# Patient Record
Sex: Male | Born: 1945 | Race: White | Hispanic: No | Marital: Married | Smoking: Never smoker
Health system: Southern US, Community
[De-identification: ages and names within clinical notes are randomized; demographics above are authoritative.]

## PROBLEM LIST (undated history)

## (undated) DIAGNOSIS — I10 Essential (primary) hypertension: Secondary | ICD-10-CM

## (undated) HISTORY — PX: TONSILLECTOMY: SUR1361

---

## 2013-11-23 ENCOUNTER — Emergency Department
Admission: EM | Admit: 2013-11-23 | Discharge: 2013-11-23 | Disposition: A | Discharge status: Home / Self Care | Payer: Medicare Other | Source: Home / Self Care | Attending: Family Medicine | Admitting: Family Medicine

## 2013-11-23 ENCOUNTER — Encounter: Payer: Self-pay | Admitting: Emergency Medicine

## 2013-11-23 DIAGNOSIS — S61210A Laceration without foreign body of right index finger without damage to nail, initial encounter: Secondary | ICD-10-CM

## 2013-11-23 DIAGNOSIS — S61209A Unspecified open wound of unspecified finger without damage to nail, initial encounter: Secondary | ICD-10-CM

## 2013-11-23 HISTORY — DX: Essential (primary) hypertension: I10

## 2013-11-23 NOTE — ED Provider Notes (Signed)
CSN: [id number]     Arrival date & time 11/23/13  1443 History   First MD Initiated Contact with Patient 11/23/13 1452     Chief Complaint  Patient presents with  . Extremity Laceration    R index finger      HPI Comments: Patient was using an electric lawn blower about 2 hours prior when the plastic fan blade accidentally lacerated the tip of his second finger.  His Tdap is current.  Patient is a 68 y.o. male presenting with skin laceration. The history is provided by the patient.  Laceration Location:  Finger Finger laceration location:  R index finger Length (cm):  2 Depth:  Through dermis Quality comment:  Flap laceration Bleeding: controlled with pressure   Time since incident:  2 hours Injury mechanism: fan blade edge. Pain details:    Quality:  Aching   Severity:  Mild   Progression:  Unchanged Foreign body present:  No foreign bodies Relieved by:  Pressure Worsened by:  Movement Ineffective treatments:  None tried Tetanus status:  Up to date   Past Medical History  Diagnosis Date  . Hypertension    Past Surgical History  Procedure Laterality Date  . Tonsillectomy     History reviewed. No pertinent family history. History  Substance Use Topics  . Smoking status: Never Smoker   . Smokeless tobacco: Never Used  . Alcohol Use: Not on file    Review of Systems  All other systems reviewed and are negative.   Allergies  Penicillins  Home Medications   Prior to Admission medications   Medication Sig Start Date End Date Taking? Authorizing Provider  olmesartan (BENICAR) 20 MG tablet Take 10 mg by mouth daily.   Yes Historical Provider, MD   BP 179/95  Pulse 76  Temp(Src) 97.8 F (36.6 C) (Oral)  Ht  (1.676 m)  Wt 160 lb (72.576 kg)  BMI 25.84 kg/m2  SpO2 98% Physical Exam  Nursing note and vitals reviewed. Constitutional: He is oriented to person, place, and time. He appears well-developed and well-nourished. No distress.  HENT:  Head:  Atraumatic.  Eyes: Conjunctivae are normal. Pupils are equal, round, and reactive to light.  Musculoskeletal:       Right hand: He exhibits laceration. He exhibits no swelling. Normal sensation noted. Normal strength noted.       Hands: Right second finger distal phalanx has a 2cm long flap laceration on its radial aspect, not involving nail bed.  Distal neurovascular function is intact. DIP joint has full range of motion.  Neurological: He is alert and oriented to person, place, and time.  Skin: Skin is warm and dry.    ED Course  Procedures  Laceration Repair Discussed benefits and risks of procedure and verbal consent obtained. Using sterile technique and digital 2% lidocaine without epinephrine, cleansed wound with Betadine followed by copious lavage with normal saline.  Wound carefully inspected for debris and foreign bodies; none found.  Wound closed with #7, 5-0 interrupted nylon sutures.  Bacitracin and non-stick sterile dressing applied.  Wound precautions explained to patient.  Return for suture removal in 10 days.     MDM   1. Laceration of right index finger w/o foreign body w/o damage to nail, initial encounter     Change dressing daily and apply Bacitracin ointment to wound.  Keep wound clean and dry.  Return for any signs of infection (or follow-up with family doctor):  Increasing redness, swelling, pain, heat, drainage, etc. Return  in 10 days for suture removal.      Lattie Haw, MD 11/23/13 1630

## 2013-11-23 NOTE — ED Notes (Signed)
Laceration of tip of R index finger in blower fan. Tetanus 3 yrs

## 2013-11-23 NOTE — Discharge Instructions (Signed)
Change dressing daily and apply Bacitracin ointment to wound.  Keep wound clean and dry.  Return for any signs of infection (or follow-up with family doctor):  Increasing redness, swelling, pain, heat, drainage, etc. °Return in 10 days for suture removal.   ° ° °Laceration Care, Adult °A laceration is a cut or lesion that goes through all layers of the skin and into the tissue just beneath the skin. °TREATMENT  °Some lacerations may not require closure. Some lacerations may not be able to be closed due to an increased risk of infection. It is important to see your caregiver as soon as possible after an injury to minimize the risk of infection and maximize the opportunity for successful closure. °If closure is appropriate, pain medicines may be given, if needed. The wound will be cleaned to help prevent infection. Your caregiver will use stitches (sutures), staples, wound glue (adhesive), or skin adhesive strips to repair the laceration. These tools bring the skin edges together to allow for faster healing and a better cosmetic outcome. However, all wounds will heal with a scar. Once the wound has healed, scarring can be minimized by covering the wound with sunscreen during the day for 1 full year. °HOME CARE INSTRUCTIONS  °For sutures or staples: °· Keep the wound clean and dry. °· If you were given a bandage (dressing), you should change it at least once a day. Also, change the dressing if it becomes wet or dirty, or as directed by your caregiver. °· Wash the wound with soap and water 2 times a day. Rinse the wound off with water to remove all soap. Pat the wound dry with a clean towel. °· After cleaning, apply a thin layer of the antibiotic ointment as recommended by your caregiver. This will help prevent infection and keep the dressing from sticking. °· You may shower as usual after the first 24 hours. Do not soak the wound in water until the sutures are removed. °· Only take over-the-counter or prescription  medicines for pain, discomfort, or fever as directed by your caregiver. °· Get your sutures or staples removed as directed by your caregiver. °For skin adhesive strips: °· Keep the wound clean and dry. °· Do not get the skin adhesive strips wet. You may bathe carefully, using caution to keep the wound dry. °· If the wound gets wet, pat it dry with a clean towel. °· Skin adhesive strips will fall off on their own. You may trim the strips as the wound heals. Do not remove skin adhesive strips that are still stuck to the wound. They will fall off in time. °For wound adhesive: °· You may briefly wet your wound in the shower or bath. Do not soak or scrub the wound. Do not swim. Avoid periods of heavy perspiration until the skin adhesive has fallen off on its own. After showering or bathing, gently pat the wound dry with a clean towel. °· Do not apply liquid medicine, cream medicine, or ointment medicine to your wound while the skin adhesive is in place. This may loosen the film before your wound is healed. °· If a dressing is placed over the wound, be careful not to apply tape directly over the skin adhesive. This may cause the adhesive to be pulled off before the wound is healed. °· Avoid prolonged exposure to sunlight or tanning lamps while the skin adhesive is in place. Exposure to ultraviolet light in the first year will darken the scar. °· The skin adhesive will usually   remain in place for 5 to 10 days, then naturally fall off the skin. Do not pick at the adhesive film. °You may need a tetanus shot if: °· You cannot remember when you had your last tetanus shot. °· You have never had a tetanus shot. °If you get a tetanus shot, your arm may swell, get red, and feel warm to the touch. This is common and not a problem. If you need a tetanus shot and you choose not to have one, there is a rare chance of getting tetanus. Sickness from tetanus can be serious. °SEEK MEDICAL CARE IF:  °· You have redness, swelling, or  increasing pain in the wound. °· You see a red line that goes away from the wound. °· You have yellowish-white fluid (pus) coming from the wound. °· You have a fever. °· You notice a bad smell coming from the wound or dressing. °· Your wound breaks open before or after sutures have been removed. °· You notice something coming out of the wound such as wood or glass. °· Your wound is on your hand or foot and you cannot move a finger or toe. °SEEK IMMEDIATE MEDICAL CARE IF:  °· Your pain is not controlled with prescribed medicine. °· You have severe swelling around the wound causing pain and numbness or a change in color in your arm, hand, leg, or foot. °· Your wound splits open and starts bleeding. °· You have worsening numbness, weakness, or loss of function of any joint around or beyond the wound. °· You develop painful lumps near the wound or on the skin anywhere on your body. °MAKE SURE YOU:  °· Understand these instructions. °· Will watch your condition. °· Will get help right away if you are not doing well or get worse. °Document Released: 03/20/2005 Document Revised: 06/12/2011 Document Reviewed: 09/13/2010 °ExitCare® Patient Information ©2015 [employer]. This information is not intended to replace advice given to you by your health care provider. Make sure you discuss any questions you have with your health care provider. ° °

## 2013-12-04 ENCOUNTER — Emergency Department (INDEPENDENT_AMBULATORY_CARE_PROVIDER_SITE_OTHER)
Admission: EM | Admit: 2013-12-04 | Discharge: 2013-12-04 | Disposition: A | Discharge status: Home / Self Care | Payer: Medicare Other | Source: Home / Self Care

## 2013-12-04 ENCOUNTER — Encounter: Payer: Self-pay | Admitting: Emergency Medicine

## 2013-12-04 DIAGNOSIS — Z4802 Encounter for removal of sutures: Secondary | ICD-10-CM

## 2013-12-04 NOTE — ED Notes (Signed)
Pt is here for suture removal of right index finger. No s/s of infection. Feeling some pain with nail growing back.

## 2013-12-04 NOTE — ED Provider Notes (Signed)
CSN: [id number]     Arrival date & time 12/04/13  2952 History   None    Chief Complaint  Patient presents with  . Suture / Staple Removal   (Consider location/radiation/quality/duration/timing/severity/associated sxs/prior Treatment) HPI Pt is here for suture removal of right index finger.  Laceration was repaired here on 11/23/13.  No s/s of infection. Feeling some mild pain with nail growing back.  Denies numbness or weakness of right index finger.  Past Medical History  Diagnosis Date  . Hypertension    Past Surgical History  Procedure Laterality Date  . Tonsillectomy     History reviewed. No pertinent family history. History  Substance Use Topics  . Smoking status: Never Smoker   . Smokeless tobacco: Never Used  . Alcohol Use: Not on file    Review of Systems  Allergies  Penicillins  Home Medications   Prior to Admission medications   Medication Sig Start Date End Date Taking? Authorizing Provider  olmesartan (BENICAR) 20 MG tablet Take 10 mg by mouth daily.    Historical Provider, MD   BP 171/99  Pulse 83  Temp(Src) 97.8 F (36.6 C) (Oral) Physical Exam Wound right index finger is clean and dry and well approximated and healing well. Fingernail intact . Range of motion, tenderness, neurovascular distally intact ED Course  Procedures (including critical care time) Labs Review Labs Reviewed - No data to display  Imaging Review No results found.   MDM   1. Encounter for removal of sutures    status post laceration right index finger repair--healing well Today, 7 sutures removed without problems. Dressing applied. Further aftercare discussed. Red flags discussed. Recheck when necessary. Also, I briefly mentioned elevated BP, advised him to followup with PCP.--He states he just took his BP meds before coming to visit today    Lajean Manes, MD 12/04/13 [phone]

## 2013-12-22 ENCOUNTER — Encounter: Payer: Self-pay | Admitting: Emergency Medicine

## 2013-12-22 ENCOUNTER — Emergency Department
Admission: EM | Admit: 2013-12-22 | Discharge: 2013-12-22 | Disposition: A | Discharge status: Home / Self Care | Payer: Medicare Other | Source: Home / Self Care | Attending: Emergency Medicine | Admitting: Emergency Medicine

## 2013-12-22 DIAGNOSIS — L03019 Cellulitis of unspecified finger: Secondary | ICD-10-CM

## 2013-12-22 DIAGNOSIS — L03011 Cellulitis of right finger: Principal | ICD-10-CM

## 2013-12-22 DIAGNOSIS — L02519 Cutaneous abscess of unspecified hand: Secondary | ICD-10-CM

## 2013-12-22 DIAGNOSIS — IMO0001 Reserved for inherently not codable concepts without codable children: Secondary | ICD-10-CM

## 2013-12-22 MED ORDER — DOXYCYCLINE HYCLATE 100 MG PO CAPS: 100.0000 mg | ORAL_CAPSULE | Freq: Two times a day (BID) | ORAL | Status: DC

## 2013-12-22 NOTE — ED Provider Notes (Signed)
CSN: [id number]     Arrival date & time 12/22/13  0845 History   First MD Initiated Contact with Patient 12/22/13 [phone]     Chief Complaint  Patient presents with  . Wound Infection   (Consider location/radiation/quality/duration/timing/severity/associated sxs/prior Treatment) HPI Reports developing infection in right finger about 5 days ago; same finger which was lacerated 11/23/2013 and treated here [facility] with suturing/ suturee removal 12/04/2013.--I. reviewed notes of 8/23 and 9/3, and the laceration is healed without sign of infection as of 12/04/13 visit.  He recalls no injury since the laceration of 8/23. Complains of mild redness and pain and swelling right index finger for 5 days. No drainage or open wound. Denies numbness or weakness. No fever or chills or chest pain or shortness of breath  His wife has been applying OTC cortisone cream without improvement Past Medical History  Diagnosis Date  . Hypertension    Past Surgical History  Procedure Laterality Date  . Tonsillectomy     History reviewed. No pertinent family history. History  Substance Use Topics  . Smoking status: Never Smoker   . Smokeless tobacco: Never Used  . Alcohol Use: Not on file    Review of Systems  All other systems reviewed and are negative.   Allergies  Penicillins  Home Medications   Prior to Admission medications   Medication Sig Start Date End Date Taking? Authorizing Provider  doxycycline (VIBRAMYCIN) 100 MG capsule Take 1 capsule (100 mg total) by mouth 2 (two) times daily. 12/22/13   Lajean Manes, MD  olmesartan (BENICAR) 20 MG tablet Take 10 mg by mouth daily.    Historical Provider, MD   BP 161/91  Pulse 81  Temp(Src) 97.8 F (36.6 C) (Oral)  Resp 16  Ht  (1.676 m)  Wt 162 lb (73.483 kg)  BMI 26.16 kg/m2  SpO2 97% Physical Exam  Nursing note and vitals reviewed. Constitutional: He is oriented to person, place, and time. He appears well-developed and well-nourished. No  distress.  HENT:  Head: Normocephalic and atraumatic.  Eyes: Conjunctivae and EOM are normal. Pupils are equal, round, and reactive to light. No scleral icterus.  Neck: Normal range of motion.  Cardiovascular: Normal rate.   Pulmonary/Chest: Effort normal.  Abdominal: He exhibits no distension.  Musculoskeletal: Normal range of motion.  Right index finger: Prior wound is well healed without sign of infection at the approximated healed edges.  Right index fingertip mildly red and swollen and mildly warm and indurated without fluctuance or red streaks or drainage or bleeding.  Neurological: He is alert and oriented to person, place, and time.  Skin: Skin is warm.  Psychiatric: He has a normal mood and affect.    ED Course  Procedures (including critical care time) Labs Review Labs Reviewed - No data to display  Imaging Review No results found.   MDM   1. Cellulitis of second finger, right   I don't think this was directly related to the prior wound, as it had healed without sign of infection as of 12/04/13.  Treatment options discussed, as well as risks, benefits, alternatives. Patient and wife voiced understanding and agreement with the following plans:  Doxycycline po BID, topical bacitracin.  red flags discussed with patient and wife. Wife has been using cortisone cream, advised to avoid that.  Follow-up with your primary care doctor in 5-7 days if not improving, or sooner if symptoms become worse. Precautions discussed. Red flags discussed. Questions invited and answered. Patient and wife voiced understanding  and agreement.    Lajean Manes, MD 12/22/13 2106

## 2013-12-22 NOTE — ED Notes (Signed)
Reports developing infection in right finger about 5 days ago; same finger which was lacerated 11/23/2013 and treated here [facility] with suturing/ suturee removal 12/04/2013. Finger tip red and edematous.

## 2013-12-23 ENCOUNTER — Telehealth: Payer: Self-pay | Admitting: *Deleted

## 2013-12-26 ENCOUNTER — Telehealth: Payer: Self-pay | Admitting: *Deleted

## 2014-04-29 ENCOUNTER — Encounter: Payer: Self-pay | Admitting: *Deleted

## 2014-04-29 ENCOUNTER — Emergency Department (INDEPENDENT_AMBULATORY_CARE_PROVIDER_SITE_OTHER): Payer: Medicare HMO

## 2014-04-29 ENCOUNTER — Emergency Department
Admission: EM | Admit: 2014-04-29 | Discharge: 2014-04-29 | Disposition: A | Discharge status: Home / Self Care | Payer: Commercial Managed Care - HMO | Source: Home / Self Care | Attending: Family Medicine | Admitting: Family Medicine

## 2014-04-29 DIAGNOSIS — B9789 Other viral agents as the cause of diseases classified elsewhere: Principal | ICD-10-CM

## 2014-04-29 DIAGNOSIS — J069 Acute upper respiratory infection, unspecified: Secondary | ICD-10-CM

## 2014-04-29 DIAGNOSIS — H6121 Impacted cerumen, right ear: Secondary | ICD-10-CM

## 2014-04-29 DIAGNOSIS — R05 Cough: Secondary | ICD-10-CM

## 2014-04-29 MED ORDER — GUAIFENESIN-CODEINE 100-10 MG/5ML PO SOLN: ORAL | Status: DC

## 2014-04-29 MED ORDER — CEFDINIR 300 MG PO CAPS: 300.0000 mg | ORAL_CAPSULE | Freq: Two times a day (BID) | ORAL | Status: DC

## 2014-04-29 NOTE — Discharge Instructions (Signed)
Take plain guaifenesin 1200mg  (Mucinex) twice daily, with plenty of water, for cough and congestion.  Get adequate rest.   May use Afrin nasal spray (or generic oxymetazoline) twice daily for about 5 days.  Also recommend using saline nasal spray several times daily and saline nasal irrigation (AYR is a common brand) Try warm salt water gargles for sore throat.  Stop all antihistamines for now, and other non-prescription cough/cold preparations. Follow-up with family doctor if not improving about 7 to 10 days.    Cerumen Impaction A cerumen impaction is when the wax in your ear forms a plug. This plug usually causes reduced hearing. Sometimes it also causes an earache or dizziness. Removing a cerumen impaction can be difficult and painful. The wax sticks to the ear canal. The canal is sensitive and bleeds easily. If you try to remove a heavy wax buildup with a cotton tipped swab, you may push it in further. Irrigation with water, suction, and small ear curettes may be used to clear out the wax. If the impaction is fixed to the skin in the ear canal, ear drops may be needed for a few days to loosen the wax. People who build up a lot of wax frequently can use ear wax removal products available in your local drugstore. SEEK MEDICAL CARE IF:  You develop an earache, increased hearing loss, or marked dizziness. Document Released: 04/27/2004 Document Revised: 06/12/2011 Document Reviewed: 06/17/2009 [facility] Patient Information 2015 [place], [place]. This information is not intended to replace advice given to you by your health care provider. Make sure you discuss any questions you have with your health care provider.

## 2014-04-29 NOTE — ED Notes (Signed)
Jonathan Tyler c/o dry cough x 2 weeks, worsening over the last couple days and productive at times. Taken Coricidin OTC. Denies fever until possible low grade last night.

## 2014-04-29 NOTE — ED Provider Notes (Signed)
CSN: [id number]     Arrival date & time 04/29/14  [id number] History   First MD Initiated Contact with Patient 04/29/14 [phone]     Chief Complaint  Patient presents with  . Cough      HPI Comments: Patient reports that he has had a persistent mild cough upon awakening for several months.  He denies reflux symptoms. Over the past three days his cough has increased, and he developed pleuritic pain in his right anterior inferior chest.  No shortness of breath.  He has had sinus congestion for two days with onset of a mild sore throat, worse when coughing.  He has been fatigued and developed a low grade fever yesterday. He has a past history of pneumonia 3 to 4 years ago.  The history is provided by the patient and the spouse.    Past Medical History  Diagnosis Date  . Hypertension    Past Surgical History  Procedure Laterality Date  . Tonsillectomy     History reviewed. No pertinent family history. History  Substance Use Topics  . Smoking status: Never Smoker   . Smokeless tobacco: Never Used  . Alcohol Use: No    Review of Systems + sore throat with cough + cough + pleuritic pain right anterior chest No wheezing + nasal congestion + post-nasal drainage No sinus pain/pressure No itchy/red eyes No earache No hemoptysis No SOB + fever, + chills No nausea No vomiting No abdominal pain No diarrhea No urinary symptoms No skin rash + fatigue + myalgias + headache Used OTC meds without relief  Allergies  Penicillins; Clindamycin/lincomycin; and Doxycycline  Home Medications   Prior to Admission medications   Medication Sig Start Date End Date Taking? Authorizing Provider  olmesartan-hydrochlorothiazide (BENICAR HCT) 40-25 MG per tablet Take 1 tablet by mouth daily.   Yes Historical Provider, MD  cefdinir (OMNICEF) 300 MG capsule Take 1 capsule (300 mg total) by mouth 2 (two) times daily. 04/29/14   Lattie HawStephen A Britni Driscoll, MD  guaiFENesin-codeine 100-10 MG/5ML syrup Take 10mL by  mouth at bedtime as needed for cough 04/29/14   Lattie HawStephen A Onis Markoff, MD   BP 165/80 mmHg  Pulse 97  Temp(Src) 98.8 F (37.1 C) (Oral)  Resp 16  Wt 165 lb (74.844 kg)  SpO2 95% Physical Exam Nursing notes and Vital Signs reviewed. Appearance:  Patient appears stated age, and in no acute distress Eyes:  Pupils are equal, round, and reactive to light and accomodation.  Extraocular movement is intact.  Conjunctivae are not inflamed  Ears:   Left canal and tympanic membrane normal.  Right canal partly occluded with cerumen; unable to visualize TM.  Post lavage, canal and TM normal Nose:  Mildly congested turbinates.  No sinus tenderness.   Pharynx:   Minimal erythema Neck:  Supple.   Tender enlarged posterior nodes are palpated bilaterally  Lungs:  Clear to auscultation.  Breath sounds are equal.  Chest:  Mild tenderness to palpation over the lower sternum.  Heart:  Regular rate and rhythm without murmurs, rubs, or gallops.  Abdomen:  Nontender without masses or hepatosplenomegaly.  Bowel sounds are present.  No CVA or flank tenderness.  Extremities:  No edema.  No calf tenderness Skin:  No rash present.   ED Course  Procedures  none    Imaging Review Dg Chest 2 View  04/29/2014   CLINICAL DATA:  Cough, congestion for 3 days.  EXAM: CHEST  2 VIEW  COMPARISON:  None.  FINDINGS: The heart size  and mediastinal contours are within normal limits. Both lungs are clear. No pneumothorax or pleural effusion is noted. Anterior osteophyte formation is noted in mid thoracic spine.  IMPRESSION: No acute cardiopulmonary abnormality seen.   Electronically Signed   By: Roque Lias M.D.   On: 04/29/2014 09:02     MDM   1. Viral URI with cough   2. Cerumen impaction, right    Prefer to give azithromycin or doxycycline, but patient does not tolerate.  Will begin Omnicef (he states that he has taken Keflex in the past without adverse effect).  Take plain guaifenesin  (Mucinex) twice daily, with  plenty of water, for cough and congestion.  Get adequate rest.   May use Afrin nasal spray (or generic oxymetazoline) twice daily for about 5 days.  Also recommend using saline nasal spray several times daily and saline nasal irrigation (AYR is a common brand) Try warm salt water gargles for sore throat.  Stop all antihistamines for now, and other non-prescription cough/cold preparations. Follow-up with family doctor if not improving about 7 to 10 days.     Lattie Haw, MD 04/29/14 [phone]

## 2014-05-01 ENCOUNTER — Telehealth: Payer: Self-pay | Admitting: Emergency Medicine

## 2015-06-26 IMAGING — CR DG CHEST 2V
2 series · 2 of 2 positions shown · non-contrast
Comparison: None.

CLINICAL DATA: Cough, congestion for 3 days.

EXAM:
CHEST  2 VIEW

[view not recorded (1 of 2)]
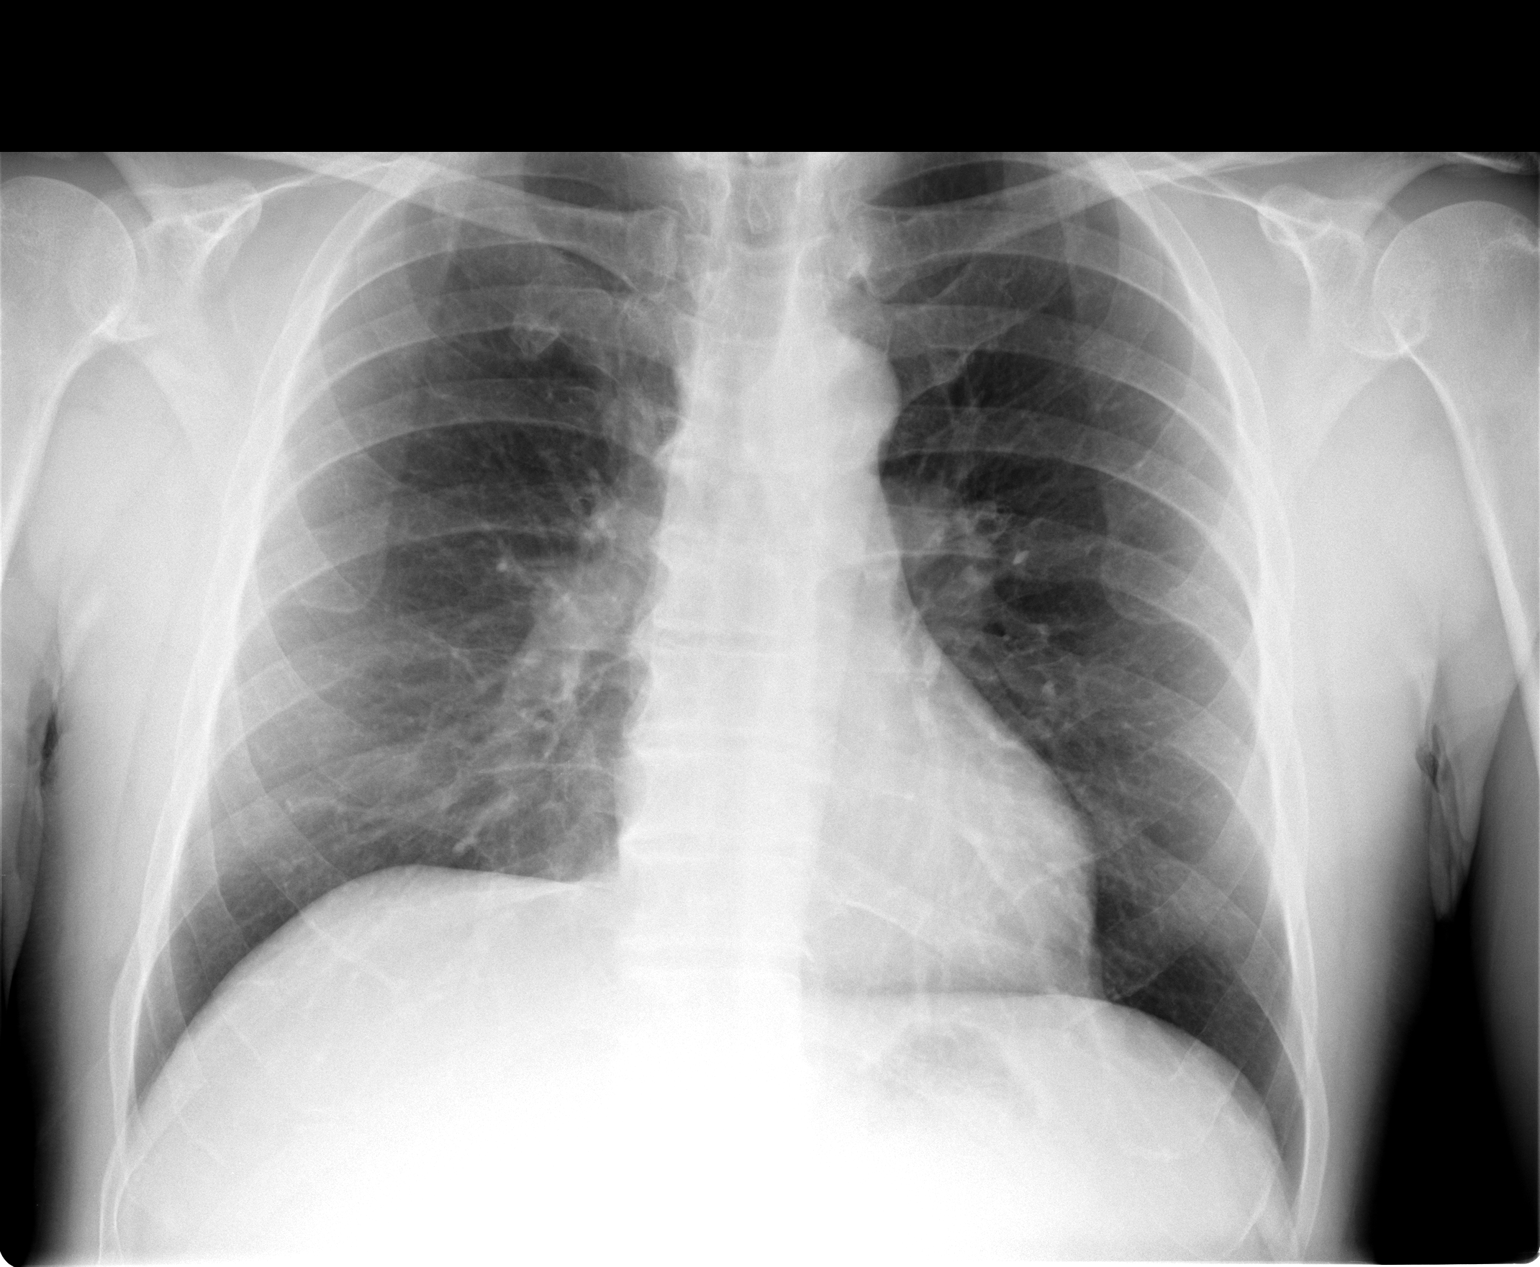

[view not recorded (2 of 2)]
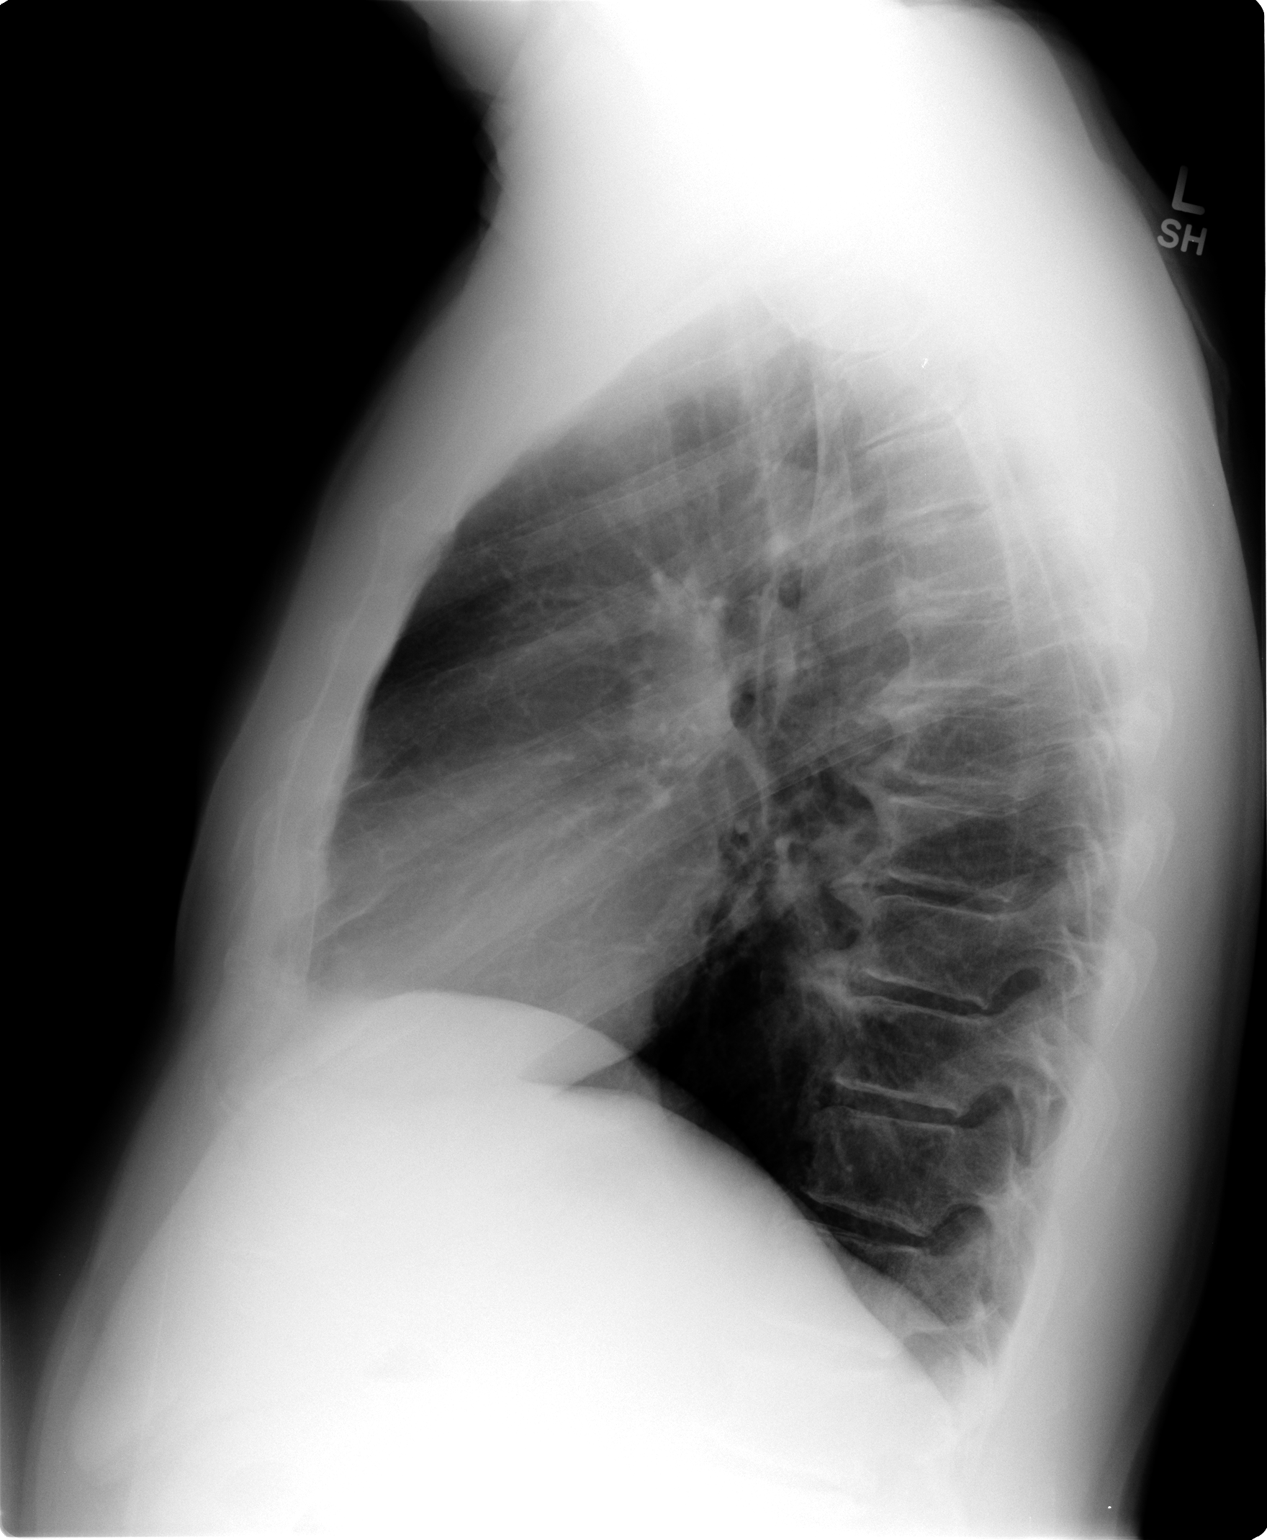

[2 of 2 positions shown; findings below may reference images not displayed]

FINDINGS: The heart size and mediastinal contours are within normal limits.
Both lungs are clear. No pneumothorax or pleural effusion is noted.
Anterior osteophyte formation is noted in mid thoracic spine.
IMPRESSION: No acute cardiopulmonary abnormality seen.

## 2015-06-29 ENCOUNTER — Emergency Department
Admission: EM | Admit: 2015-06-29 | Discharge: 2015-06-29 | Disposition: A | Discharge status: Home / Self Care | Payer: Medicare HMO | Source: Home / Self Care | Attending: Family Medicine | Admitting: Family Medicine

## 2015-06-29 DIAGNOSIS — J101 Influenza due to other identified influenza virus with other respiratory manifestations: Secondary | ICD-10-CM | POA: Diagnosis not present

## 2015-06-29 LAB — POCT INFLUENZA A/B
INFLUENZA B, POC: POSITIVE — AB
Influenza A, POC: NEGATIVE

## 2015-06-29 MED ORDER — OSELTAMIVIR PHOSPHATE 75 MG PO CAPS: 75.0000 mg | ORAL_CAPSULE | Freq: Two times a day (BID) | ORAL | Status: DC

## 2015-06-29 NOTE — ED Provider Notes (Signed)
CSN: [id number]     Arrival date & time 06/29/15  1747 History   First MD Initiated Contact with Patient 06/29/15 1842     Chief Complaint  Patient presents with  . Fever      HPI Comments: Complains of 3 day history flu-like illness including myalgias, headache, fever/chills, fatigue, and cough.  Also has mild nasal congestion.  Cough is non-productive and somewhat worse at night.  No pleuritic pain or shortness of breath.  His fever has been as high as 103.  The history is provided by the patient.    Past Medical History  Diagnosis Date  . Hypertension    Past Surgical History  Procedure Laterality Date  . Tonsillectomy     History reviewed. No pertinent family history. Social History  Substance Use Topics  . Smoking status: Never Smoker   . Smokeless tobacco: Never Used  . Alcohol Use: No    Review of Systems No sore throat + cough No pleuritic pain No wheezing + nasal congestion + post-nasal drainage No sinus pain/pressure No itchy/red eyes No earache No hemoptysis No SOB + fever, + chills No nausea No vomiting No abdominal pain No diarrhea No urinary symptoms No skin rash + fatigue + myalgias + headache Used OTC meds without relief  Allergies  Penicillins; Clindamycin/lincomycin; and Doxycycline  Home Medications   Prior to Admission medications   Medication Sig Start Date End Date Taking? Authorizing Provider  cefdinir (OMNICEF) 300 MG capsule Take 1 capsule (300 mg total) by mouth 2 (two) times daily. 04/29/14   Lattie HawStephen A Serria Sloma, MD  guaiFENesin-codeine 100-10 MG/5ML syrup Take 10mL by mouth at bedtime as needed for cough 04/29/14   Lattie HawStephen A Wynonna Fitzhenry, MD  olmesartan-hydrochlorothiazide (BENICAR HCT) 40-25 MG per tablet Take 1 tablet by mouth daily.    Historical Provider, MD  oseltamivir (TAMIFLU) 75 MG capsule Take 1 capsule (75 mg total) by mouth every 12 (twelve) hours. 06/29/15   Lattie HawStephen A Mckena Chern, MD   Meds Ordered and Administered this Visit   Medications - No data to display  BP 165/87 mmHg  Pulse 94  Temp(Src) 101.3 F (38.5 C) (Oral)  Ht 5\' 6"  (1.676 m)  Wt 160 lb (72.576 kg)  BMI 25.84 kg/m2  SpO2 94% No data found.   Physical Exam Nursing notes and Vital Signs reviewed. Appearance:  Patient appears stated age, and in no acute distress Eyes:  Pupils are equal, round, and reactive to light and accomodation.  Extraocular movement is intact.  Conjunctivae are not inflamed  Ears:  Canals normal.  Tympanic membranes normal.  Nose:  Mildly congested turbinates.  No sinus tenderness.   Pharynx:  Normal Neck:  Supple.  Tender enlarged posterior/lateral nodes are palpated bilaterally  Lungs:  Clear to auscultation.  Breath sounds are equal.  Moving air well. Heart:  Regular rate and rhythm without murmurs, rubs, or gallops.  Abdomen:  Nontender without masses or hepatosplenomegaly.  Bowel sounds are present.  No CVA or flank tenderness.  Extremities:  No edema.  Skin:  No rash present.   ED Course  Procedures none    Labs Reviewed  POCT INFLUENZA A/B - Abnormal; Notable for the following:    Influenza B, POC Positive (*)    All other components within normal limits     MDM   1. Influenza B    Begin Tamiflu Will treat wife prophylactically Take plain guaifenesin (1200mg  extended release tabs such as Mucinex) twice daily, with plenty of water,  for cough and congestion.   Get adequate rest.   Try warm salt water gargles for sore throat.  Stop all antihistamines for now, and other non-prescription cough/cold preparations. May take Ibuprofen , 4 tabs every 8 hours with food for body aches, fever, etc. May take Delsym Cough Suppressant at bedtime for nighttime cough.  Follow-up with family doctor if not improving about 6 days.    Lattie Haw, MD 07/05/15 [phone]

## 2015-06-29 NOTE — ED Notes (Signed)
Sunday had cough and runny nose.  Monday afternoon, cough went into chest, started having a fever.  Denies vomiting.  Temp was 103 around 5:15 and took 4 advil.

## 2015-06-29 NOTE — Discharge Instructions (Signed)
Take plain guaifenesin (1200mg  extended release tabs such as Mucinex) twice daily, with plenty of water, for cough and congestion.   Get adequate rest.   Try warm salt water gargles for sore throat.  Stop all antihistamines for now, and other non-prescription cough/cold preparations. May take Ibuprofen 200mg , 4 tabs every 8 hours with food for body aches, fever, etc. May take Delsym Cough Suppressant at bedtime for nighttime cough.  Follow-up with family doctor if not improving about 6 days.

## 2017-09-27 ENCOUNTER — Emergency Department
Admission: EM | Admit: 2017-09-27 | Discharge: 2017-09-27 | Disposition: A | Discharge status: Home / Self Care | Payer: Medicare HMO | Source: Home / Self Care | Attending: Family Medicine | Admitting: Family Medicine

## 2017-09-27 ENCOUNTER — Other Ambulatory Visit: Payer: Self-pay

## 2017-09-27 DIAGNOSIS — H6123 Impacted cerumen, bilateral: Secondary | ICD-10-CM | POA: Diagnosis not present

## 2017-09-27 MED ORDER — NEOMYCIN-POLYMYXIN-HC 3.5-10000-1 OT SUSP: 4.0000 [drp] | Freq: Three times a day (TID) | OTIC | 0 refills | Status: DC

## 2017-09-27 NOTE — ED Triage Notes (Signed)
Pt feels that his ears are clogged.  Tried peroxide, but still feels stopped up.

## 2017-09-27 NOTE — Discharge Instructions (Addendum)
To prevent recurrence, try the following: Soak two cotton balls with mineral oil, and gently place in each ear canal once weekly.  Leave the cotton balls in place for 10 to 20 minutes.  This will help liquefy the ear wax and aid your body's normal elimination process.  If applicable, do not use a hearing aid for 8 hours overnight.  Have your ears cleaned by a health professional every 6 to 12 months.  Avoid using "Q-tips" and ear wax softening solutions

## 2017-09-27 NOTE — ED Provider Notes (Signed)
[name]    CSN: [id number] Arrival date & time: 09/27/17  0804     History   Chief Complaint Chief Complaint  Patient presents with  . Cerumen Impaction    HPI Jonathan Tyler is a 72 y.o. male.   Patient complains of both ears being full for about 3 days.  He tried instilling hydrogen peroxide without improvement.  He denies earache.  The history is provided by the patient and the spouse.  Ear Fullness  This is a new problem. The current episode started more than 2 days ago. The problem occurs constantly. The problem has been gradually worsening. Associated symptoms comments: No earache. Nothing aggravates the symptoms. Nothing relieves the symptoms. Treatments tried: H2O2. The treatment provided no relief.    Past Medical History:  Diagnosis Date  . Hypertension     There are no active problems to display for this patient.   Past Surgical History:  Procedure Laterality Date  . TONSILLECTOMY         Home Medications    Prior to Admission medications   Medication Sig Start Date End Date Taking? Authorizing Provider  cefdinir (OMNICEF) 300 MG capsule Take 1 capsule (300 mg total) by mouth 2 (two) times daily. 04/29/14   Lattie Haw, MD  guaiFENesin-codeine 100-10 MG/5ML syrup Take 10mL by mouth at bedtime as needed for cough 04/29/14   Lattie Haw, MD  neomycin-polymyxin-hydrocortisone (CORTISPORIN) 3.5-10000-1 OTIC suspension Place 4 drops into both ears 3 (three) times daily. 09/27/17   Lattie Haw, MD  olmesartan-hydrochlorothiazide (BENICAR HCT) 40-25 MG per tablet Take 1 tablet by mouth daily.    [provider]  oseltamivir (TAMIFLU) 75 MG capsule Take 1 capsule (75 mg total) by mouth every 12 (twelve) hours. 06/29/15   Lattie Haw, MD    Family History History reviewed. No pertinent family history.  Social History Social History   Tobacco Use  . Smoking status: Never Smoker  . Smokeless tobacco: Never Used    Substance Use Topics  . Alcohol use: No  . Drug use: No     Allergies   Penicillins; Clindamycin/lincomycin; and Doxycycline   Review of Systems Review of Systems  Constitutional: Negative for chills, diaphoresis, fatigue and fever.  All other systems reviewed and are negative.    Physical Exam Triage Vital Signs ED Triage Vitals [09/27/17 0823]  Enc Vitals Group     BP (!) 148/81     Pulse Rate 85     Resp      Temp 98.2 F (36.8 C)     Temp Source Oral     SpO2 96 %     Weight      Height      Head Circumference      Peak Flow      Pain Score 0     Pain Loc      Pain Edu?      Excl. in GC?    No data found.  Updated Vital Signs BP (!) 148/81 (BP Location: Right Arm)   Pulse 85   Temp 98.2 F (36.8 C) (Oral)   Ht 5\' 6"  (1.676 m)   Wt 168 lb (76.2 kg)   SpO2 96%   BMI 27.12 kg/m   Visual Acuity Right Eye Distance:   Left Eye Distance:   Bilateral Distance:    Right Eye Near:   Left Eye Near:    Bilateral Near:     Physical Exam Nursing  notes and Vital Signs reviewed. Appearance:  Patient appears stated age, and in no acute distress Eyes:  Pupils are equal, round, and reactive to light and accomodation.  Extraocular movement is intact.  Conjunctivae are not inflamed  Ears:   Both canals occluded with cerumen.  Unable to remove cerumen with ear lavage.  Left canal mildly erythematous posteriorly after lavage. Nose:  Normal Pharynx:  Normal Neck:  Supple.  No adenopathy Lungs:  Normal respiration Heart:   Normal rate    UC Treatments / Results  Labs (all labs ordered are listed, but only abnormal results are displayed) Labs Reviewed - No data to display  EKG None  Radiology No results found.  Procedures Procedures  Bilateral ear lavage by nurse.  Medications Ordered in UC Medications - No data to display  Initial Impression / Assessment and Plan / UC Course  I have reviewed the triage vital signs and the nursing  notes.  Pertinent labs & imaging results that were available during my care of the patient were reviewed by me and considered in my medical decision making (see chart for details).    Begin Cortisporin Otic Suspension.  Return in about 4 to 5 days for repeat ear lavage (or may follow-up with ENT).  May insert cotton ball soaked with mineral oil in each ear once daily for about 10 minutes before follow-up visit.   Final Clinical Impressions(s) / UC Diagnoses   Final diagnoses:  Bilateral impacted cerumen     Discharge Instructions     To prevent recurrence, try the following: Soak two cotton balls with mineral oil, and gently place in each ear canal once weekly.  Leave the cotton balls in place for 10 to 20 minutes.  This will help liquefy the ear wax and aid your body's normal elimination process.  If applicable, do not use a hearing aid for 8 hours overnight.  Have your ears cleaned by a health professional every 6 to 12 months.  Avoid using "Q-tips" and ear wax softening solutions     ED Prescriptions    Medication Sig Dispense Auth. Provider   neomycin-polymyxin-hydrocortisone (CORTISPORIN) 3.5-10000-1 OTIC suspension Place 4 drops into both ears 3 (three) times daily. 7.5 mL Lattie HawBeese, Elyana Grabski A, MD         Lattie HawBeese, Veva Grimley A, MD 09/27/17 [phone]

## 2018-10-02 ENCOUNTER — Emergency Department
Admission: EM | Admit: 2018-10-02 | Discharge: 2018-10-02 | Disposition: A | Discharge status: Home / Self Care | Payer: Medicare HMO | Source: Home / Self Care | Attending: Family Medicine | Admitting: Family Medicine

## 2018-10-02 ENCOUNTER — Telehealth: Payer: Self-pay | Admitting: Emergency Medicine

## 2018-10-02 ENCOUNTER — Other Ambulatory Visit: Payer: Self-pay

## 2018-10-02 ENCOUNTER — Encounter: Payer: Self-pay | Admitting: Emergency Medicine

## 2018-10-02 DIAGNOSIS — L237 Allergic contact dermatitis due to plants, except food: Secondary | ICD-10-CM

## 2018-10-02 MED ORDER — METHYLPREDNISOLONE SODIUM SUCC 40 MG IJ SOLR
40.0000 mg | Freq: Once | INTRAMUSCULAR | Status: AC
  Administered 2018-10-02: 40 mg via INTRAMUSCULAR

## 2018-10-02 MED ORDER — PREDNISONE 20 MG PO TABS: 20.0000 mg | ORAL_TABLET | Freq: Every day | ORAL | 0 refills | Status: DC

## 2018-10-02 NOTE — Discharge Instructions (Signed)
°  Keep rash clean with warm (not hot) water and mild soap. Pat dry. You may apply over the counter calamine lotion or cortisone cream to help with itching.  You were given a shot of decadron (a steroid) today to help with itching and swelling from a likely allergic reaction.  You have been prescribed 3 days of prednisone, an oral steroid.  You may start this medication tomorrow with breakfast.

## 2018-10-02 NOTE — ED Triage Notes (Signed)
Patient was in contact with poison ivy 8 days ago and developed rash 5 days ago on both arms. No OTCs today. He has not travelled.

## 2018-10-02 NOTE — ED Provider Notes (Signed)
[facility]    CSN: [id number] Arrival date & time: 10/02/18  1157     History   Chief Complaint Chief Complaint  Patient presents with   Rash    HPI Jonathan Tyler is a 73 y.o. male.   HPI  Jonathan Tyler is a 73 y.o. male presenting to UC with c/o gradually worsening itching red rash on his hands and forearms that started about 5 days ago. Pt notes he was working outside and believes he came into contact with poison ivy 8 days ago.  He washed his skin with alcohol and 10% bleach in hopes of preventing a rash.  Rash is mildly itchy but does not hurt. Denies oral swelling or SOB. No medications or creams tried at home.  Denies new soaps, lotions or medications. Hx of similar rash in the past. He has done well with steroid injections and oral prednisone in the past.    Past Medical History:  Diagnosis Date   Hypertension     There are no active problems to display for this patient.   Past Surgical History:  Procedure Laterality Date   TONSILLECTOMY         Home Medications    Prior to Admission medications   Medication Sig Start Date End Date Taking? Authorizing Provider  cefdinir (OMNICEF) 300 MG capsule Take 1 capsule (300 mg total) by mouth 2 (two) times daily. 04/29/14   Kandra Nicolas, MD  guaiFENesin-codeine 100-10 MG/5ML syrup Take 59mL by mouth at bedtime as needed for cough 04/29/14   Kandra Nicolas, MD  neomycin-polymyxin-hydrocortisone (CORTISPORIN) 3.5-10000-1 OTIC suspension Place 4 drops into both ears 3 (three) times daily. 09/27/17   Kandra Nicolas, MD  olmesartan-hydrochlorothiazide (BENICAR HCT) 40-25 MG per tablet Take 1 tablet by mouth daily.    [provider]  oseltamivir (TAMIFLU) 75 MG capsule Take 1 capsule (75 mg total) by mouth every 12 (twelve) hours. 06/29/15   Kandra Nicolas, MD  predniSONE (DELTASONE) 20 MG tablet Take 1 tablet (20 mg total) by mouth daily with breakfast. 10/02/18   Noe Gens, PA-C     Family History No family history on file.  Social History Social History   Tobacco Use   Smoking status: Never Smoker   Smokeless tobacco: Never Used  Substance Use Topics   Alcohol use: No   Drug use: No     Allergies   Penicillins, Clindamycin/lincomycin, and Doxycycline   Review of Systems Review of Systems  Respiratory: Negative for chest tightness and shortness of breath.   Musculoskeletal: Negative for arthralgias and joint swelling.  Skin: Positive for rash. Negative for wound.     Physical Exam Triage Vital Signs ED Triage Vitals  Enc Vitals Group     BP 10/02/18 1237 (!) 165/91     Pulse Rate 10/02/18 1237 77     Resp 10/02/18 1237 16     Temp 10/02/18 1237 97.8 F (36.6 C)     Temp Source 10/02/18 1237 Oral     SpO2 10/02/18 1237 97 %     Weight --      Height --      Head Circumference --      Peak Flow --      Pain Score 10/02/18 1238 0     Pain Loc --      Pain Edu? --      Excl. in [place]? --    No data found.  Updated Vital Signs BP Marland Kitchen)  165/91 (BP Location: Right Arm)    Pulse 77    Temp 97.8 F (36.6 C) (Oral)    Resp 16    SpO2 97%   Visual Acuity Right Eye Distance:   Left Eye Distance:   Bilateral Distance:    Right Eye Near:   Left Eye Near:    Bilateral Near:     Physical Exam Vitals signs and nursing note reviewed.  Constitutional:      Appearance: Normal appearance. He is well-developed.  HENT:     Head: Normocephalic and atraumatic.     Mouth/Throat:     Mouth: Mucous membranes are moist.  Neck:     Musculoskeletal: Normal range of motion.  Cardiovascular:     Rate and Rhythm: Normal rate.  Pulmonary:     Effort: Pulmonary effort is normal. No respiratory distress.  Musculoskeletal: Normal range of motion.  Skin:    General: Skin is warm and dry.     Findings: Erythema and rash present.     Comments: Bilateral forearms and hands: diffuse erythematous vesicular and papular rash, areas of linear distributions.  Non-tender.  Neurological:     Mental Status: He is alert and oriented to person, place, and time.  Psychiatric:        Behavior: Behavior normal.      UC Treatments / Results  Labs (all labs ordered are listed, but only abnormal results are displayed) Labs Reviewed - No data to display  EKG   Radiology No results found.  Procedures Procedures (including critical care time)  Medications Ordered in UC Medications  methylPREDNISolone sodium succinate (SOLU-MEDROL) 40 mg/mL injection 40 mg (40 mg Intramuscular Given 10/02/18 1244)    Initial Impression / Assessment and Plan / UC Course  I have reviewed the triage vital signs and the nursing notes.  Pertinent labs & imaging results that were available during my care of the patient were reviewed by me and considered in my medical decision making (see chart for details).     Hx and exam c/w contact dermatitis Solu-medrol 40mg  IM given in UC AVS provided  Final Clinical Impressions(s) / UC Diagnoses   Final diagnoses:  Allergic contact dermatitis due to plants, except food     Discharge Instructions      Keep rash clean with warm (not hot) water and mild soap. Pat dry. You may apply over the counter calamine lotion or cortisone cream to help with itching.  You were given a shot of decadron (a steroid) today to help with itching and swelling from a likely allergic reaction.  You have been prescribed 3 days of prednisone, an oral steroid.  You may start this medication tomorrow with breakfast.       ED Prescriptions    Medication Sig Dispense Auth. Provider   predniSONE (DELTASONE) 20 MG tablet Take 1 tablet (20 mg total) by mouth daily with breakfast. 3 tablet Lurene ShadowPhelps, Brindley Madarang O, PA-C     Controlled Substance Prescriptions [place] Controlled Substance Registry consulted? Not Applicable   Rolla Platehelps, Ike Maragh O, PA-C 10/02/18 1303

## 2018-10-03 NOTE — Telephone Encounter (Signed)
Spoke with patient, feeling better, shows signs of improvement.  Will follow up as needed.

## 2022-02-17 ENCOUNTER — Ambulatory Visit
Admission: EM | Admit: 2022-02-17 | Discharge: 2022-02-17 | Disposition: A | Discharge status: Home / Self Care | Payer: Medicare HMO

## 2022-02-17 ENCOUNTER — Encounter: Payer: Self-pay | Admitting: Emergency Medicine

## 2022-02-17 DIAGNOSIS — R059 Cough, unspecified: Secondary | ICD-10-CM

## 2022-02-17 DIAGNOSIS — J309 Allergic rhinitis, unspecified: Secondary | ICD-10-CM

## 2022-02-17 MED ORDER — AZITHROMYCIN 250 MG PO TABS: 250.0000 mg | ORAL_TABLET | Freq: Every day | ORAL | 0 refills | Status: DC

## 2022-02-17 MED ORDER — BENZONATATE 200 MG PO CAPS: 200.0000 mg | ORAL_CAPSULE | Freq: Three times a day (TID) | ORAL | 0 refills | Status: AC | PRN

## 2022-02-17 MED ORDER — FEXOFENADINE HCL 180 MG PO TABS: 180.0000 mg | ORAL_TABLET | Freq: Every day | ORAL | 0 refills | Status: AC

## 2022-02-17 NOTE — ED Provider Notes (Signed)
[facility]    CSN: [id number] Arrival date & time: 02/17/22  6433      History   Chief Complaint Chief Complaint  Patient presents with   Cough    HPI Jonathan Tyler is a 76 y.o. male.   HPI Pleasant 75 year old male presents with cough for 2 days.  PMH significant for HTN.  Past Medical History:  Diagnosis Date   Hypertension     There are no problems to display for this patient.   Past Surgical History:  Procedure Laterality Date   TONSILLECTOMY         Home Medications    Prior to Admission medications   Medication Sig Start Date End Date Taking? Authorizing Provider  azithromycin (ZITHROMAX) 250 MG tablet Take 1 tablet (250 mg total) by mouth daily. Take first 2 tablets together, then 1 every day until finished. 02/17/22  Yes Trevor Iha, FNP  benzonatate (TESSALON) 200 MG capsule Take 1 capsule (200 mg total) by mouth 3 (three) times daily as needed for up to 7 days. 02/17/22 02/24/22 Yes Trevor Iha, FNP  fexofenadine [facility] ALLERGY) 180 MG tablet Take 1 tablet (180 mg total) by mouth daily for 15 days. 02/17/22 03/04/22 Yes Trevor Iha, FNP  losartan (COZAAR) 50 MG tablet Take 50 mg by mouth daily. 10/21/21  Yes [provider]  tadalafil (CIALIS) 5 MG tablet Take 1 tablet by mouth daily. 01/23/22  Yes [provider]  tamsulosin (FLOMAX) 0.4 MG CAPS capsule Take 1 capsule by mouth daily. 01/23/22  Yes [provider]  zolpidem (AMBIEN CR) 12.5 MG CR tablet Take by mouth. 03/26/20  Yes [provider]  neomycin-polymyxin-hydrocortisone (CORTISPORIN) 3.5-10000-1 OTIC suspension Place 4 drops into both ears 3 (three) times daily. 09/27/17   Lattie Haw, MD  olmesartan-hydrochlorothiazide (BENICAR HCT) 40-25 MG per tablet Take 1 tablet by mouth daily.    [provider]    Family History History reviewed. No pertinent family history.  Social History Social History   Tobacco Use    Smoking status: Never   Smokeless tobacco: Never  Substance Use Topics   Alcohol use: No   Drug use: No     Allergies   Penicillins, Clindamycin/lincomycin, and Doxycycline   Review of Systems Review of Systems  Respiratory:  Positive for cough.   All other systems reviewed and are negative.    Physical Exam Triage Vital Signs ED Triage Vitals  Enc Vitals Group     BP 02/17/22 0900 (!) 179/84     Pulse Rate 02/17/22 0900 92     Resp 02/17/22 0900 18     Temp 02/17/22 0900 98.7 F (37.1 C)     Temp Source 02/17/22 0900 Oral     SpO2 02/17/22 0900 96 %     Weight --      Height --      Head Circumference --      Peak Flow --      Pain Score 02/17/22 0902 0     Pain Loc --      Pain Edu? --      Excl. in GC? --    No data found.  Updated Vital Signs BP (!) 179/84 (BP Location: Right Arm)   Pulse 92   Temp 98.7 F (37.1 C) (Oral)   Resp 18   SpO2 96%      Physical Exam Vitals and nursing note reviewed.  Constitutional:      General: He is not  in acute distress.    Appearance: Normal appearance. He is normal weight. He is not ill-appearing.  HENT:     Head: Normocephalic and atraumatic.     Right Ear: Tympanic membrane and external ear normal.     Left Ear: Tympanic membrane and external ear normal.     Ears:     Comments: Moderate eustachian tube dysfunction noted bilaterally    Mouth/Throat:     Mouth: Mucous membranes are moist.     Pharynx: Oropharynx is clear.     Comments: Moderate amount of clear drainage of posterior oropharynx noted Eyes:     Extraocular Movements: Extraocular movements intact.     Conjunctiva/sclera: Conjunctivae normal.     Pupils: Pupils are equal, round, and reactive to light.  Cardiovascular:     Rate and Rhythm: Normal rate and regular rhythm.     Pulses: Normal pulses.     Heart sounds: Normal heart sounds.  Pulmonary:     Effort: Pulmonary effort is normal.     Breath sounds: Normal breath sounds. No wheezing,  rhonchi or rales.     Comments: Infrequent nonproductive cough noted on exam Musculoskeletal:        General: Normal range of motion.     Cervical back: Normal range of motion and neck supple.  Lymphadenopathy:     Cervical: No cervical adenopathy.  Skin:    General: Skin is warm and dry.  Neurological:     General: No focal deficit present.     Mental Status: He is alert and oriented to person, place, and time.      UC Treatments / Results  Labs (all labs ordered are listed, but only abnormal results are displayed) Labs Reviewed - No data to display  EKG   Radiology No results found.  Procedures Procedures (including critical care time)  Medications Ordered in UC Medications - No data to display  Initial Impression / Assessment and Plan / UC Course  I have reviewed the triage vital signs and the nursing notes.  Pertinent labs & imaging results that were available during my care of the patient were reviewed by me and considered in my medical decision making (see chart for details).     MDM: 1.  Cough-Rx'd Zithromax, Tessalon. Advised patient to take medication as directed with food to completion.  Advised may use Tessalon daily or as needed for cough.  Encouraged patient increase daily water intake while taking these medications.  Advised if symptoms worsen and/or unresolved please follow-up with PCP or here for further evaluation.  Discharged home, hemodynamically stable. Final Clinical Impressions(s) / UC Diagnoses   Final diagnoses:  Cough, unspecified type  Allergic rhinitis, unspecified seasonality, unspecified trigger     Discharge Instructions      Advised patient to take medication as directed with food to completion.  Advised patient to take Allegra with Zithromax daily for the next 5 days.  Advised may take Allegra as needed afterwards for concurrent postnasal drainage/drip.  Advised may use Tessalon daily or as needed for cough.  Encouraged patient  increase daily water intake to 64 ounces per day while while taking these medications.  Advised if symptoms worsen and/or unresolved please follow-up with PCP or here for further evaluation.     ED Prescriptions     Medication Sig Dispense Auth. Provider   azithromycin (ZITHROMAX) 250 MG tablet Take 1 tablet (250 mg total) by mouth daily. Take first 2 tablets together, then 1 every day until finished.  6 tablet Trevor Iha, FNP   fexofenadine [facility] ALLERGY) 180 MG tablet Take 1 tablet (180 mg total) by mouth daily for 15 days. 15 tablet Trevor Iha, FNP   benzonatate (TESSALON) 200 MG capsule Take 1 capsule (200 mg total) by mouth 3 (three) times daily as needed for up to 7 days. 40 capsule Trevor Iha, FNP      PDMP not reviewed this encounter.   Trevor Iha, FNP 02/17/22 [phone]

## 2022-02-17 NOTE — ED Triage Notes (Signed)
Pt c/o cough and sore throat for 2 days. Taking vitamins but no OTC meds.

## 2022-02-17 NOTE — Discharge Instructions (Addendum)
Advised patient to take medication as directed with food to completion.  Advised patient to take Allegra with Zithromax daily for the next 5 days.  Advised may take Allegra as needed afterwards for concurrent postnasal drainage/drip.  Advised may use Tessalon daily or as needed for cough.  Encouraged patient increase daily water intake to 64 ounces per day while while taking these medications.  Advised if symptoms worsen and/or unresolved please follow-up with PCP or here for further evaluation.

## 2022-02-19 ENCOUNTER — Other Ambulatory Visit: Payer: Self-pay

## 2022-02-19 ENCOUNTER — Ambulatory Visit
Admission: EM | Admit: 2022-02-19 | Discharge: 2022-02-19 | Disposition: A | Discharge status: Home / Self Care | Payer: Medicare HMO | Attending: Family Medicine | Admitting: Family Medicine

## 2022-02-19 ENCOUNTER — Ambulatory Visit (INDEPENDENT_AMBULATORY_CARE_PROVIDER_SITE_OTHER): Payer: Medicare HMO

## 2022-02-19 DIAGNOSIS — R059 Cough, unspecified: Secondary | ICD-10-CM

## 2022-02-19 DIAGNOSIS — R0689 Other abnormalities of breathing: Secondary | ICD-10-CM

## 2022-02-19 DIAGNOSIS — J40 Bronchitis, not specified as acute or chronic: Secondary | ICD-10-CM | POA: Diagnosis not present

## 2022-02-19 LAB — POCT RAPID STREP A (OFFICE): Rapid Strep A Screen: NEGATIVE

## 2022-02-19 MED ORDER — PREDNISONE 10 MG (21) PO TBPK: ORAL_TABLET | Freq: Every day | ORAL | 0 refills | Status: DC

## 2022-02-19 MED ORDER — HYDROCODONE BIT-HOMATROP MBR 5-1.5 MG/5ML PO SOLN: 5.0000 mL | Freq: Four times a day (QID) | ORAL | 0 refills | Status: DC | PRN

## 2022-02-19 MED ORDER — CEFDINIR 300 MG PO CAPS: 300.0000 mg | ORAL_CAPSULE | Freq: Two times a day (BID) | ORAL | 0 refills | Status: AC

## 2022-02-19 NOTE — Discharge Instructions (Addendum)
Advised patient to take medication as directed with food to completion.  Advised patient to take Sterapred Unipak with first dose of Cefdinir for the next 10 days.  Advised may use Hycodan for cough at night prior to sleep due to sedative effects.  Encouraged patient to increase daily water intake to 64 ounces per day.  Advised patient if symptoms worsen and/or unresolved please follow-up with PCP or here for further evaluation.

## 2022-02-19 NOTE — ED Provider Notes (Signed)
[name]    CSN: [id number] Arrival date & time: 02/19/22  1044      History   Chief Complaint Chief Complaint  Patient presents with   Sore Throat    HPI Jonathan Tyler is a 76 y.o. male.   HPI 76 year old male presents with sore throat now onset last week.  Patient was evaluated by me on Friday, 02/17/2022 treated with Zithromax and prednisone.  PMH significant for HTN.  Past Medical History:  Diagnosis Date   Hypertension     There are no problems to display for this patient.   Past Surgical History:  Procedure Laterality Date   TONSILLECTOMY         Home Medications    Prior to Admission medications   Medication Sig Start Date End Date Taking? Authorizing Provider  cefdinir (OMNICEF) 300 MG capsule Take 1 capsule (300 mg total) by mouth 2 (two) times daily for 10 days. 02/19/22 03/01/22 Yes Trevor Iha, FNP  HYDROcodone bit-homatropine (HYCODAN) 5-1.5 MG/5ML syrup Take 5 mLs by mouth every 6 (six) hours as needed for cough. 02/19/22  Yes Trevor Iha, FNP  predniSONE (STERAPRED UNI-PAK 21 TAB) 10 MG (21) TBPK tablet Take by mouth daily. Take 6 tabs by mouth daily  for 2 days, then 5 tabs for 2 days, then 4 tabs for 2 days, then 3 tabs for 2 days, 2 tabs for 2 days, then 1 tab by mouth daily for 2 days 02/19/22  Yes Trevor Iha, FNP  azithromycin (ZITHROMAX) 250 MG tablet Take 1 tablet (250 mg total) by mouth daily. Take first 2 tablets together, then 1 every day until finished. 02/17/22   Trevor Iha, FNP  benzonatate (TESSALON) 200 MG capsule Take 1 capsule (200 mg total) by mouth 3 (three) times daily as needed for up to 7 days. 02/17/22 02/24/22  Trevor Iha, FNP  fexofenadine (ALLEGRA ALLERGY) 180 MG tablet Take 1 tablet (180 mg total) by mouth daily for 15 days. 02/17/22 03/04/22  Trevor Iha, FNP  losartan (COZAAR) 50 MG tablet Take 50 mg by mouth daily. 10/21/21   [provider]  neomycin-polymyxin-hydrocortisone  (CORTISPORIN) 3.5-10000-1 OTIC suspension Place 4 drops into both ears 3 (three) times daily. 09/27/17   Lattie Haw, MD  olmesartan-hydrochlorothiazide (BENICAR HCT) 40-25 MG per tablet Take 1 tablet by mouth daily.    [provider]  tadalafil (CIALIS) 5 MG tablet Take 1 tablet by mouth daily. 01/23/22   [provider]  tamsulosin (FLOMAX) 0.4 MG CAPS capsule Take 1 capsule by mouth daily. 01/23/22   [provider]  zolpidem (AMBIEN CR) 12.5 MG CR tablet Take by mouth. 03/26/20   [provider]    Family History History reviewed. No pertinent family history.  Social History Social History   Tobacco Use   Smoking status: Never   Smokeless tobacco: Never  Substance Use Topics   Alcohol use: No   Drug use: No     Allergies   Penicillins, Clindamycin/lincomycin, and Doxycycline   Review of Systems Review of Systems  All other systems reviewed and are negative.    Physical Exam Triage Vital Signs ED Triage Vitals  Enc Vitals Group     BP 02/19/22 1205 (!) 156/87     Pulse Rate 02/19/22 1205 82     Resp 02/19/22 1205 17     Temp 02/19/22 1205 98.1 F (36.7 C)     Temp Source 02/19/22 1205 Oral     SpO2 02/19/22 1205  94 %     Weight 02/19/22 1208 165 lb (74.8 kg)     Height --      Head Circumference --      Peak Flow --      Pain Score 02/19/22 1207 7     Pain Loc --      Pain Edu? --      Excl. in [place]? --    No data found.  Updated Vital Signs BP (!) 156/87 (BP Location: Right Arm)   Pulse 82   Temp 98.1 F (36.7 C) (Oral)   Resp 17   Wt 165 lb (74.8 kg)   SpO2 94%   BMI 26.63 kg/m    Physical Exam Vitals and nursing note reviewed.  Constitutional:      Appearance: Normal appearance. He is normal weight.  HENT:     Head: Normocephalic and atraumatic.     Right Ear: Tympanic membrane, ear canal and external ear normal.     Left Ear: Tympanic membrane, ear canal and external ear normal.     Nose: Nose  normal.     Mouth/Throat:     Mouth: Mucous membranes are moist.     Pharynx: Oropharynx is clear.  Eyes:     Extraocular Movements: Extraocular movements intact.     Conjunctiva/sclera: Conjunctivae normal.     Pupils: Pupils are equal, round, and reactive to light.  Cardiovascular:     Rate and Rhythm: Normal rate and regular rhythm.     Pulses: Normal pulses.     Heart sounds: Normal heart sounds.  Pulmonary:     Effort: Pulmonary effort is normal.     Breath sounds: Wheezing and rhonchi present. No rales.     Comments: Diffuse scattered rhonchi with mild inspiratory wheeze noted Musculoskeletal:        General: Normal range of motion.     Cervical back: Normal range of motion and neck supple.  Skin:    General: Skin is warm and dry.  Neurological:     General: No focal deficit present.     Mental Status: He is alert and oriented to person, place, and time.      UC Treatments / Results  Labs (all labs ordered are listed, but only abnormal results are displayed) Labs Reviewed  POCT RAPID STREP A (OFFICE)    EKG   Radiology DG Chest 2 View  Result Date: 02/19/2022 CLINICAL DATA:  Cough EXAM: CHEST - 2 VIEW COMPARISON:  04/29/2014 FINDINGS: The heart size and mediastinal contours are within normal limits. Mildly coarsened bibasilar interstitial markings. No lobar consolidation. No pleural effusion or pneumothorax. The visualized skeletal structures are unremarkable. IMPRESSION: Mildly coarsened bibasilar interstitial markings, which may reflect bronchitic type lung changes. Electronically Signed   By: Duanne Guess D.O.   On: 02/19/2022 13:11    Procedures Procedures (including critical care time)  Medications Ordered in UC Medications - No data to display  Initial Impression / Assessment and Plan / UC Course  I have reviewed the triage vital signs and the nursing notes.  Pertinent labs & imaging results that were available during my care of the patient were  reviewed by me and considered in my medical decision making (see chart for details).     MDM: 1.  Bronchitis-Rx'd Omnicef, Sterapred Unipak; 2.  Adventitious breath sounds/cough-CXR revealed above, Rx'd Hycodan. Advised patient to take medication as directed with food to completion.  Advised patient to take Sterapred Unipak with first dose of  Cefdinir for the next 10 days.  Advised may use Hycodan for cough at night prior to sleep due to sedative effects.  Encouraged patient to increase daily water intake to 64 ounces per day.  Advised patient if symptoms worsen and/or unresolved please follow-up with PCP or here for further evaluation.  Patient discharged home, hemodynamically stable. Final Clinical Impressions(s) / UC Diagnoses   Final diagnoses:  Adventitious breath sounds  Bronchitis  Cough, unspecified type     Discharge Instructions      Advised patient to take medication as directed with food to completion.  Advised patient to take Sterapred Unipak with first dose of Cefdinir for the next 10 days.  Advised may use Hycodan for cough at night prior to sleep due to sedative effects.  Encouraged patient to increase daily water intake to 64 ounces per day.  Advised patient if symptoms worsen and/or unresolved please follow-up with PCP or here for further evaluation.     ED Prescriptions     Medication Sig Dispense Auth. Provider   cefdinir (OMNICEF) 300 MG capsule Take 1 capsule (300 mg total) by mouth 2 (two) times daily for 10 days. 20 capsule Trevor Iha, FNP   predniSONE (STERAPRED UNI-PAK 21 TAB) 10 MG (21) TBPK tablet Take by mouth daily. Take 6 tabs by mouth daily  for 2 days, then 5 tabs for 2 days, then 4 tabs for 2 days, then 3 tabs for 2 days, 2 tabs for 2 days, then 1 tab by mouth daily for 2 days 42 tablet Trevor Iha, FNP   HYDROcodone bit-homatropine (HYCODAN) 5-1.5 MG/5ML syrup Take 5 mLs by mouth every 6 (six) hours as needed for cough. 120 mL Trevor Iha, FNP       I have reviewed the PDMP during this encounter.   Trevor Iha, FNP 02/19/22 1341

## 2022-02-19 NOTE — ED Triage Notes (Signed)
Sore throat onset since last week

## 2022-04-26 ENCOUNTER — Ambulatory Visit
Admission: EM | Admit: 2022-04-26 | Discharge: 2022-04-26 | Disposition: A | Discharge status: Home / Self Care | Payer: Medicare HMO | Attending: Family Medicine | Admitting: Family Medicine

## 2022-04-26 ENCOUNTER — Encounter: Payer: Self-pay | Admitting: Emergency Medicine

## 2022-04-26 DIAGNOSIS — S39012A Strain of muscle, fascia and tendon of lower back, initial encounter: Secondary | ICD-10-CM | POA: Diagnosis not present

## 2022-04-26 MED ORDER — METHOCARBAMOL 500 MG PO TABS: 500.0000 mg | ORAL_TABLET | Freq: Three times a day (TID) | ORAL | 0 refills | Status: DC

## 2022-04-26 MED ORDER — PREDNISONE 10 MG (21) PO TBPK: ORAL_TABLET | Freq: Every day | ORAL | 0 refills | Status: DC

## 2022-04-26 NOTE — ED Provider Notes (Signed)
[facility]    CSN: [id number] Arrival date & time: 04/26/22  0815      History   Chief Complaint Chief Complaint  Patient presents with   Back Pain    HPI Jonathan Tyler is a 77 y.o. male.   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  He is here today with his wife.  Patient states that he was lifting heavy rock and hurt his low back several days ago.  Then he was going through a box of old charts, he states he had 1 knee on the ground and was leaned over flipping through them with his hands for long period of time.  When he stood up he realized that he had strained his SI joint.  The SI joint on his left side is quite painful.  He has been trying to treated at home.  Has been taking Celebrex 100 mg twice daily and Robaxin 100 mg as needed.  He is here because he feels like he needs a steroid pack.  No numbness or weakness into the legs.  No other health concerns.    Past Medical History:  Diagnosis Date   Hypertension     There are no problems to display for this patient.   Past Surgical History:  Procedure Laterality Date   TONSILLECTOMY         Home Medications    Prior to Admission medications   Medication Sig Start Date End Date Taking? Authorizing Provider  losartan (COZAAR) 50 MG tablet Take 50 mg by mouth daily. 10/21/21  Yes [provider]  methocarbamol (ROBAXIN) 500 MG tablet Take 1 tablet (500 mg total) by mouth 3 (three) times daily. 04/26/22  Yes Eustace Moore, MD  olmesartan-hydrochlorothiazide (BENICAR HCT) 40-25 MG per tablet Take 1 tablet by mouth daily.   Yes [provider]  tadalafil (CIALIS) 5 MG tablet Take 1 tablet by mouth daily. 01/23/22  Yes [provider]  tamsulosin (FLOMAX) 0.4 MG CAPS capsule Take 1 capsule by mouth daily. 01/23/22  Yes [provider]  zolpidem (AMBIEN CR) 12.5 MG CR tablet Take by mouth. 03/26/20  Yes [provider]  fexofenadine (ALLEGRA  ALLERGY) 180 MG tablet Take 1 tablet (180 mg total) by mouth daily for 15 days. 02/17/22 03/04/22  Trevor Iha, FNP  predniSONE (STERAPRED UNI-PAK 21 TAB) 10 MG (21) TBPK tablet Take by mouth daily. Take 6 tabs by mouth daily  for 2 days, then 5 tabs for 2 days, then 4 tabs for 2 days, then 3 tabs for 2 days, 2 tabs for 2 days, then 1 tab by mouth daily for 2 days 04/26/22   Eustace Moore, MD    Family History History reviewed. No pertinent family history.  Social History Social History   Tobacco Use   Smoking status: Never   Smokeless tobacco: Never  Substance Use Topics   Alcohol use: No   Drug use: No     Allergies   Clindamycin/lincomycin   Review of Systems Review of Systems See HPI  Physical Exam Triage Vital Signs ED Triage Vitals  Enc Vitals Group     BP 04/26/22 0828 (!) 165/83     Pulse Rate 04/26/22 0828 81     Resp 04/26/22 0828 18     Temp 04/26/22 0828 98.2 F (36.8 C)     Temp Source 04/26/22 0828 Oral     SpO2 04/26/22 0828 94 %     Weight  04/26/22 0830 165 lb (74.8 kg)     Height 04/26/22 0830 5\' 6"  (1.676 m)     Head Circumference --      Peak Flow --      Pain Score 04/26/22 0907 2     Pain Loc --      Pain Edu? --      Excl. in [place]? --    No data found.  Updated Vital Signs BP (!) 165/83 (BP Location: Left Arm)   Pulse 81   Temp 98.2 F (36.8 C) (Oral)   Resp 18   Ht 5\' 6"  (1.676 m)   Wt 74.8 kg   SpO2 94%   BMI 26.63 kg/m      Physical Exam Constitutional:      General: He is not in acute distress.    Appearance: He is well-developed and normal weight.     Comments: Appears uncomfortable  HENT:     Head: Normocephalic and atraumatic.  Eyes:     Conjunctiva/sclera: Conjunctivae normal.     Pupils: Pupils are equal, round, and reactive to light.  Cardiovascular:     Rate and Rhythm: Normal rate.  Pulmonary:     Effort: Pulmonary effort is normal. No respiratory distress.  Abdominal:     General: There is no  distension.     Palpations: Abdomen is soft.  Musculoskeletal:        General: Tenderness present. No swelling, deformity or signs of injury. Normal range of motion.     Cervical back: Normal range of motion.     Comments: Tender sacroiliac joint on the left  Skin:    General: Skin is warm and dry.  Neurological:     Mental Status: He is alert.     Motor: No weakness.     Coordination: Coordination normal.     Gait: Gait normal.     Deep Tendon Reflexes: Reflexes normal.      UC Treatments / Results  Labs (all labs ordered are listed, but only abnormal results are displayed) Labs Reviewed - No data to display  EKG   Radiology No results found.  Procedures Procedures (including critical care time)  Medications Ordered in UC Medications - No data to display  Initial Impression / Assessment and Plan / UC Course  I have reviewed the triage vital signs and the nursing notes.  Pertinent labs & imaging results that were available during my care of the patient were reviewed by me and considered in my medical decision making (see chart for details).     Final Clinical Impressions(s) / UC Diagnoses   Final diagnoses:  Sacroiliac strain, initial encounter     Discharge Instructions      Take the steroid as directed I have prescribed robaxin at 500 mg dose, may take 3 x a day.  May cause drowsiness    ED Prescriptions     Medication Sig Dispense Auth. Provider   predniSONE (STERAPRED UNI-PAK 21 TAB) 10 MG (21) TBPK tablet Take by mouth daily. Take 6 tabs by mouth daily  for 2 days, then 5 tabs for 2 days, then 4 tabs for 2 days, then 3 tabs for 2 days, 2 tabs for 2 days, then 1 tab by mouth daily for 2 days 42 tablet Raylene Everts, MD   methocarbamol (ROBAXIN) 500 MG tablet Take 1 tablet (500 mg total) by mouth 3 (three) times daily. 30 tablet Raylene Everts, MD      PDMP not  reviewed this encounter.   Raylene Everts, MD 04/26/22 [phone]

## 2022-04-26 NOTE — Discharge Instructions (Signed)
Take the steroid as directed I have prescribed robaxin at 500 mg dose, may take 3 x a day.  May cause drowsiness

## 2022-04-26 NOTE — ED Triage Notes (Signed)
Patient c/o low back pain x 3-4 days after lifting heavy crates of charts.  Patient did fall yesterday.  Has taken Robaxin 100mg  and Celebrex 100mg .

## 2022-04-27 ENCOUNTER — Telehealth: Payer: Self-pay | Admitting: *Deleted

## 2022-04-27 NOTE — Telephone Encounter (Signed)
CAllback: Left message following up from visit. Call back as needed.

## 2022-08-28 ENCOUNTER — Ambulatory Visit
Admission: EM | Admit: 2022-08-28 | Discharge: 2022-08-28 | Disposition: A | Discharge status: Home / Self Care | Payer: Medicare HMO

## 2022-08-28 ENCOUNTER — Encounter: Payer: Self-pay | Admitting: Emergency Medicine

## 2022-08-28 DIAGNOSIS — H6121 Impacted cerumen, right ear: Secondary | ICD-10-CM

## 2022-08-28 DIAGNOSIS — H6011 Cellulitis of right external ear: Secondary | ICD-10-CM | POA: Diagnosis not present

## 2022-08-28 MED ORDER — DOXYCYCLINE HYCLATE 100 MG PO CAPS: 100.0000 mg | ORAL_CAPSULE | Freq: Two times a day (BID) | ORAL | 0 refills | Status: DC

## 2022-08-28 NOTE — ED Triage Notes (Addendum)
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

## 2022-08-28 NOTE — ED Provider Notes (Signed)
[name]    CSN: [id number] Arrival date & time: 08/28/22  1400      History   Chief Complaint Chief Complaint  Patient presents with   Otalgia    right    HPI [facility] is a 77 y.o. male.   HPI Pleasant 77 year old male presents with otalgia of right ear and concern with possible cellulitis of right external ear.  Patient reports was seen at [facility] dermatology and had biopsy of ear last Thursday.  Patient is accompanied by his wife this afternoon.  Past Medical History:  Diagnosis Date   Hypertension     There are no problems to display for this patient.   Past Surgical History:  Procedure Laterality Date   TONSILLECTOMY         Home Medications    Prior to Admission medications   Medication Sig Start Date End Date Taking? Authorizing Provider  doxycycline (VIBRAMYCIN) 100 MG capsule Take 1 capsule (100 mg total) by mouth 2 (two) times daily for 10 days. 08/28/22 09/07/22 Yes Trevor Iha, FNP  fexofenadine [facility] ALLERGY) 180 MG tablet Take 1 tablet (180 mg total) by mouth daily for 15 days. 02/17/22 03/04/22  Trevor Iha, FNP  hydrochlorothiazide (HYDRODIURIL) 25 MG tablet Take 25 mg by mouth daily.    [provider]  losartan (COZAAR) 50 MG tablet Take 50 mg by mouth daily. 10/21/21   [provider]  methocarbamol (ROBAXIN) 500 MG tablet Take 1 tablet (500 mg total) by mouth 3 (three) times daily. Patient not taking: Reported on 08/28/2022 04/26/22   Eustace Moore, MD  olmesartan-hydrochlorothiazide (BENICAR HCT) 40-25 MG per tablet Take 1 tablet by mouth daily. Patient not taking: Reported on 08/28/2022    [provider]  predniSONE (STERAPRED UNI-PAK 21 TAB) 10 MG (21) TBPK tablet Take by mouth daily. Take 6 tabs by mouth daily  for 2 days, then 5 tabs for 2 days, then 4 tabs for 2 days, then 3 tabs for 2 days, 2 tabs for 2 days, then 1 tab by mouth daily for 2 days Patient not taking: Reported on  08/28/2022 04/26/22   Eustace Moore, MD  tadalafil (CIALIS) 5 MG tablet Take 1 tablet by mouth daily. 01/23/22   [provider]  tamsulosin (FLOMAX) 0.4 MG CAPS capsule Take 1 capsule by mouth daily. 01/23/22   [provider]  zolpidem (AMBIEN CR) 12.5 MG CR tablet Take by mouth. 03/26/20   [provider]    Family History History reviewed. No pertinent family history.  Social History Social History   Tobacco Use   Smoking status: Never   Smokeless tobacco: Never  Vaping Use   Vaping Use: Never used  Substance Use Topics   Alcohol use: No   Drug use: No     Allergies   Penicillins and Clindamycin/lincomycin   Review of Systems Review of Systems  Skin:  Positive for rash.     Physical Exam Triage Vital Signs ED Triage Vitals  Enc Vitals Group     BP      Pulse      Resp      Temp      Temp src      SpO2      Weight      Height      Head Circumference      Peak Flow      Pain Score      Pain Loc  Pain Edu?      Excl. in [place]?    No data found.  Updated Vital Signs BP 137/84 (BP Location: Left Arm)   Pulse 80   Temp 98.6 F (37 C) (Oral)   Resp 16   Wt 164 lb 14.5 oz (74.8 kg)   SpO2 95%   BMI 26.62 kg/m   Visual Acuity Right Eye Distance:   Left Eye Distance:   Bilateral Distance:    Right Eye Near:   Left Eye Near:    Bilateral Near:     Physical Exam Vitals and nursing note reviewed.  Constitutional:      Appearance: Normal appearance. He is normal weight.  HENT:     Head: Normocephalic and atraumatic.     Right Ear: External ear normal.     Left Ear: Tympanic membrane, ear canal and external ear normal.     Ears:     Comments: Right ear: EAC occluded by excessive cerumen unable to visualize right TM; post unilateral ear lavage: Right EAC-predominantly clear with scant cerumen remaining, right TM; cloudy retracted    Mouth/Throat:     Mouth: Mucous membranes are moist.     Pharynx: Oropharynx is  clear.  Eyes:     Extraocular Movements: Extraocular movements intact.     Conjunctiva/sclera: Conjunctivae normal.     Pupils: Pupils are equal, round, and reactive to light.  Cardiovascular:     Rate and Rhythm: Normal rate and regular rhythm.     Pulses: Normal pulses.     Heart sounds: Normal heart sounds.  Pulmonary:     Effort: Pulmonary effort is normal.     Breath sounds: Normal breath sounds.  Abdominal:     General: Bowel sounds are normal.  Musculoskeletal:        General: Normal range of motion.     Cervical back: Normal range of motion and neck supple.  Skin:    General: Skin is warm and dry.     Comments: Right inferior lobule: Erythematous-see image below  Neurological:     General: No focal deficit present.     Mental Status: He is alert and oriented to person, place, and time.  Psychiatric:        Mood and Affect: Mood normal.        Behavior: Behavior normal.        Thought Content: Thought content normal.         UC Treatments / Results  Labs (all labs ordered are listed, but only abnormal results are displayed) Labs Reviewed - No data to display  EKG   Radiology No results found.  Procedures Procedures (including critical care time)  Medications Ordered in UC Medications - No data to display  Initial Impression / Assessment and Plan / UC Course  I have reviewed the triage vital signs and the nursing notes.  Pertinent labs & imaging results that were available during my care of the patient were reviewed by me and considered in my medical decision making (see chart for details).     MDM: 1.  Cellulitis of right-Rx'd Doxycycline 100 mg capsule twice daily x 10 days; 2.  Impacted cerumen of right ear-unresolved with unilateral ear lavage. Instructed patient to take medication as directed with food to completion.  Encouraged increase daily water intake while taking this medication.  Advised patient may use OTC Debrox 3 drops twice daily into  right EAC for the next 4 to 5 days or follow-up with local  ENT for removal remaining earwax of the right EAC.  Contact information has been provided with this AVS. patient discharged home, hemodynamically stable.  Final Clinical Impressions(s) / UC Diagnoses   Final diagnoses:  Impacted cerumen of right ear  Cellulitis of right ear   Discharge Instructions   None    ED Prescriptions     Medication Sig Dispense Auth. Provider   doxycycline (VIBRAMYCIN) 100 MG capsule Take 1 capsule (100 mg total) by mouth 2 (two) times daily for 10 days. 20 capsule Trevor Iha, FNP      PDMP not reviewed this encounter.   Trevor Iha, FNP 08/28/22 [phone]

## 2022-08-28 NOTE — Discharge Instructions (Addendum)
Instructed patient to take medication as directed with food to completion.  Encouraged increase daily water intake while taking this medication.  Advised patient may use OTC Debrox 3 drops twice daily into right EAC for the next 4 to 5 days or follow-up with local ENT for removal remaining earwax of the right EAC.  Contact information has been provided with this AVS.

## 2022-09-04 ENCOUNTER — Ambulatory Visit
Admission: EM | Admit: 2022-09-04 | Discharge: 2022-09-04 | Disposition: A | Discharge status: Home / Self Care | Payer: Medicare HMO | Attending: Urgent Care | Admitting: Urgent Care

## 2022-09-04 DIAGNOSIS — H6121 Impacted cerumen, right ear: Secondary | ICD-10-CM | POA: Diagnosis not present

## 2022-09-04 DIAGNOSIS — H60391 Other infective otitis externa, right ear: Secondary | ICD-10-CM

## 2022-09-04 MED ORDER — MUPIROCIN 2 % EX OINT: 1.0000 | TOPICAL_OINTMENT | Freq: Three times a day (TID) | CUTANEOUS | 0 refills | Status: AC

## 2022-09-04 MED ORDER — CIPROFLOXACIN HCL 0.3 % OP SOLN: OPHTHALMIC | 0 refills | Status: AC

## 2022-09-04 MED ORDER — AZITHROMYCIN 250 MG PO TABS: 250.0000 mg | ORAL_TABLET | Freq: Every day | ORAL | 0 refills | Status: AC

## 2022-09-04 MED ORDER — HYDROCORTISONE-ACETIC ACID 1-2 % OT SOLN: 3.0000 [drp] | Freq: Two times a day (BID) | OTIC | 0 refills | Status: AC

## 2022-09-04 NOTE — Discharge Instructions (Addendum)
Your ear pain is due to otitis externa, which is an infection of the ear canal. Avoid using Q-tips or anything inside the ear. Clean off your ear buds extensively with antiseptic wipes, and avoid placing back in the ear until fully treated.  Do not go swimming or submerge head in water x1 week.  Your insurance would not cover the antibiotic containing ear drops. So, to combat that, I prescribed you a steroid ear drop and an antibiotic eye drop. PLACE THE EYE DROP IN THE RIGHT EAR. Place 4 drops of the cipro, and three drops of the hydrocortisone into R ear canal twice daily x 7 days.   Keep your right ear tilted upwards toward the ceiling for 10 minutes after administration of the drops.  Please also apply the topical mupirocin ointment to your ear lobe 2-3x/ day. Take the azithromycin as prescribed.  Follow up with dermatology if your external ear infection persists. I do not have access to your pathology report; please call derm to get the results.  Follow-up with PCP should symptoms persist or worsen

## 2022-09-04 NOTE — ED Provider Notes (Signed)
[facility]    CSN: [id number] Arrival date & time: 09/04/22  0904      History   Chief Complaint Chief Complaint  Patient presents with   Otalgia    RT    HPI Jonathan Tyler is a 77 y.o. male.   Pleasant 77 year old male presents today due to concerns of continued right ear pain.  He was seen on 08/28/2022 and was prescribed doxycycline due to concerns of cellulitis to his right earlobe.  Patient states he took all of the medication as prescribed but the discomfort continues.  He also reports he will get sharp lancinating pains that will radiate up into his temporal region on the right.  He reports some decreased hearing, had a cerumen lavage performed in office last visit.  Has also been using Debrox at home with incomplete removal of the wax.  He has not heard about the pathology report from his recent right ear biopsy.   Otalgia   Past Medical History:  Diagnosis Date   Hypertension     There are no problems to display for this patient.   Past Surgical History:  Procedure Laterality Date   TONSILLECTOMY         Home Medications    Prior to Admission medications   Medication Sig Start Date End Date Taking? Authorizing Provider  acetic acid-hydrocortisone (VOSOL-HC) OTIC solution Place 3 drops into the right ear 2 (two) times daily for 7 days. 09/04/22 09/11/22 Yes Daffney Greenly L, PA  azithromycin (ZITHROMAX) 250 MG tablet Take 1 tablet (250 mg total) by mouth daily. Take first 2 tablets together, then 1 every day until finished. 09/04/22  Yes Coulter Oldaker L, PA  ciprofloxacin (CILOXAN) 0.3 % ophthalmic solution Place 4 drops into R ear twice daily x 7 days 09/04/22  Yes Archana Eckman L, PA  mupirocin ointment (BACTROBAN) 2 % Apply 1 Application topically 3 (three) times daily. 09/04/22  Yes Abby Stines L, PA  fexofenadine (ALLEGRA ALLERGY) 180 MG tablet Take 1 tablet (180 mg total) by mouth daily for 15 days. 02/17/22 03/04/22  Trevor Iha, FNP   hydrochlorothiazide (HYDRODIURIL) 25 MG tablet Take 25 mg by mouth daily.    [provider]  losartan (COZAAR) 50 MG tablet Take 50 mg by mouth daily. 10/21/21   [provider]  olmesartan-hydrochlorothiazide (BENICAR HCT) 40-25 MG per tablet Take 1 tablet by mouth daily. Patient not taking: Reported on 08/28/2022    [provider]  tadalafil (CIALIS) 5 MG tablet Take 1 tablet by mouth daily. 01/23/22   [provider]  tamsulosin (FLOMAX) 0.4 MG CAPS capsule Take 1 capsule by mouth daily. 01/23/22   [provider]  zolpidem (AMBIEN CR) 12.5 MG CR tablet Take by mouth. 03/26/20   [provider]    Family History History reviewed. No pertinent family history.  Social History Social History   Tobacco Use   Smoking status: Never   Smokeless tobacco: Never  Vaping Use   Vaping Use: Never used  Substance Use Topics   Alcohol use: No   Drug use: No     Allergies   Penicillins and Clindamycin/lincomycin   Review of Systems Review of Systems  HENT:  Positive for ear pain.   As per HPI   Physical Exam Triage Vital Signs ED Triage Vitals [09/04/22 0913]  Enc Vitals Group     BP (!) 153/83     Pulse Rate 77     Resp 16  Temp 97.7 F (36.5 C)     Temp Source Oral     SpO2 96 %     Weight      Height      Head Circumference      Peak Flow      Pain Score 3     Pain Loc      Pain Edu?      Excl. in GC?    No data found.  Updated Vital Signs BP (!) 153/83 (BP Location: Right Arm)   Pulse 77   Temp 97.7 F (36.5 C) (Oral)   Resp 16   SpO2 96%   Visual Acuity Right Eye Distance:   Left Eye Distance:   Bilateral Distance:    Right Eye Near:   Left Eye Near:    Bilateral Near:     Physical Exam   UC Treatments / Results  Labs (all labs ordered are listed, but only abnormal results are displayed) Labs Reviewed - No data to display  EKG   Radiology No results found.  Procedures Ear  Cerumen Removal  Date/Time: 09/04/2022 9:30 AM  Performed by: Maretta Bees, PA Authorized by: Maretta Bees, PA   Consent:    Consent obtained:  Verbal   Consent given by:  Patient   Risks, benefits, and alternatives were discussed: yes     Risks discussed:  Bleeding, dizziness, TM perforation, pain, infection and incomplete removal   Alternatives discussed:  No treatment, delayed treatment and alternative treatment Universal protocol:    Procedure explained and questions answered to patient or proxy's satisfaction: yes     Relevant documents present and verified: yes     Patient identity confirmed:  Verbally with patient Procedure details:    Location:  R ear   Procedure type: irrigation     Procedure outcomes: cerumen removed   Post-procedure details:    Inspection:  No bleeding and ear canal clear   Hearing quality:  Improved   Procedure completion:  Tolerated  (including critical care time)  Medications Ordered in UC Medications - No data to display  Initial Impression / Assessment and Plan / UC Course  I have reviewed the triage vital signs and the nursing notes.  Pertinent labs & imaging results that were available during my care of the patient were reviewed by me and considered in my medical decision making (see chart for details).     OE R ear - ciprodex preferred, however all otic abx drops required a prior auth through pts insurance. Will therefore given hydrocortisone/acetic acid ear drops in combination with ciprofloxacin eye drops. Pt understands the eye drops are to be used in the R ear only. External ear infection - sx consistent with staph infection of the R ear lobe. 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 Will therefore do trial of zpack and mupirocin as there was worsening infection on the doxycycline. F/U with derm if sx persist Impacted cerumen on R - removed in office.   Final Clinical Impressions(s) / UC Diagnoses   Final diagnoses:  Infective otitis  externa of right ear  Bacterial external ear infection, right  Impacted cerumen of right ear     Discharge Instructions      Your ear pain is due to otitis externa, which is an infection of the ear canal. Avoid using Q-tips or anything inside the ear. Clean off your ear buds extensively with antiseptic wipes, and avoid placing back in the ear until fully treated.  Do not go swimming or submerge head in water x1 week.  Your insurance would not cover the antibiotic containing ear drops. So, to combat that, I prescribed you a steroid ear drop and an antibiotic eye drop. PLACE THE EYE DROP IN THE RIGHT EAR. Place 4 drops of the cipro, and three drops of the hydrocortisone into R ear canal twice daily x 7 days.   Keep your right ear tilted upwards toward the ceiling for 10 minutes after administration of the drops.  Please also apply the topical mupirocin ointment to your ear lobe 2-3x/ day. Take the azithromycin as prescribed.  Follow up with dermatology if your external ear infection persists. I do not have access to your pathology report; please call derm to get the results.  Follow-up with PCP should symptoms persist or worsen      ED Prescriptions     Medication Sig Dispense Auth. Provider   ciprofloxacin (CILOXAN) 0.3 % ophthalmic solution Place 4 drops into R ear twice daily x 7 days 5 mL Librada Castronovo L, PA   acetic acid-hydrocortisone (VOSOL-HC) OTIC solution Place 3 drops into the right ear 2 (two) times daily for 7 days. 10 mL Kadence Mikkelson L, PA   mupirocin ointment (BACTROBAN) 2 % Apply 1 Application topically 3 (three) times daily. 22 g Karmello Abercrombie L, PA   azithromycin (ZITHROMAX) 250 MG tablet Take 1 tablet (250 mg total) by mouth daily. Take first 2 tablets together, then 1 every day until finished. 6 tablet Raeleigh Guinn L, [place]      PDMP not reviewed this encounter.   Maretta Bees, [place] 09/04/22 1022

## 2022-09-04 NOTE — ED Triage Notes (Signed)
Pt c/o RT ear pain x 3 weeks. Was seen here last week, had ear lavage. Also rx'd doxycycline. Took 7 days worth. Pain 3/10
